# Patient Record
Sex: Male | Born: 1985 | Race: White | Hispanic: No | Marital: Single | Smoking: Never smoker
Health system: Southern US, Community
[De-identification: ages and names within clinical notes are randomized; demographics above are authoritative.]

## PROBLEM LIST (undated history)

## (undated) DIAGNOSIS — L732 Hidradenitis suppurativa: Secondary | ICD-10-CM

## (undated) DIAGNOSIS — K219 Gastro-esophageal reflux disease without esophagitis: Secondary | ICD-10-CM

## (undated) DIAGNOSIS — F329 Major depressive disorder, single episode, unspecified: Secondary | ICD-10-CM

## (undated) DIAGNOSIS — J45909 Unspecified asthma, uncomplicated: Secondary | ICD-10-CM

## (undated) DIAGNOSIS — N390 Urinary tract infection, site not specified: Secondary | ICD-10-CM

## (undated) DIAGNOSIS — F32A Depression, unspecified: Secondary | ICD-10-CM

## (undated) DIAGNOSIS — E669 Obesity, unspecified: Secondary | ICD-10-CM

## (undated) DIAGNOSIS — B019 Varicella without complication: Secondary | ICD-10-CM

## (undated) HISTORY — DX: Varicella without complication: B01.9

## (undated) HISTORY — DX: Hidradenitis suppurativa: L73.2

## (undated) HISTORY — DX: Urinary tract infection, site not specified: N39.0

## (undated) HISTORY — PX: COSMETIC SURGERY: SHX468

## (undated) HISTORY — PX: ABDOMINAL SURGERY: SHX537

## (undated) HISTORY — PX: TONSILLECTOMY: SUR1361

## (undated) HISTORY — DX: Obesity, unspecified: E66.9

## (undated) HISTORY — PX: MYRINGOTOMY: SUR874

## (undated) HISTORY — DX: Depression, unspecified: F32.A

## (undated) HISTORY — DX: Major depressive disorder, single episode, unspecified: F32.9

## (undated) HISTORY — DX: Gastro-esophageal reflux disease without esophagitis: K21.9

---
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
## 2006-02-26 ENCOUNTER — Ambulatory Visit: Payer: Self-pay | Admitting: Oncology

## 2006-07-03 ENCOUNTER — Ambulatory Visit: Payer: Self-pay

## 2007-02-15 IMAGING — CR DG TIBIA/FIBULA 2V*L*
4 series · 4 of 4 positions shown · non-contrast
Comparison: none

CLINICAL DATA: Painful swelling in both medial malleolus.  No known trauma. 
 LEFT TIBIA AND FIBULA ? 4 VIEW:

[view not recorded (1 of 4)]
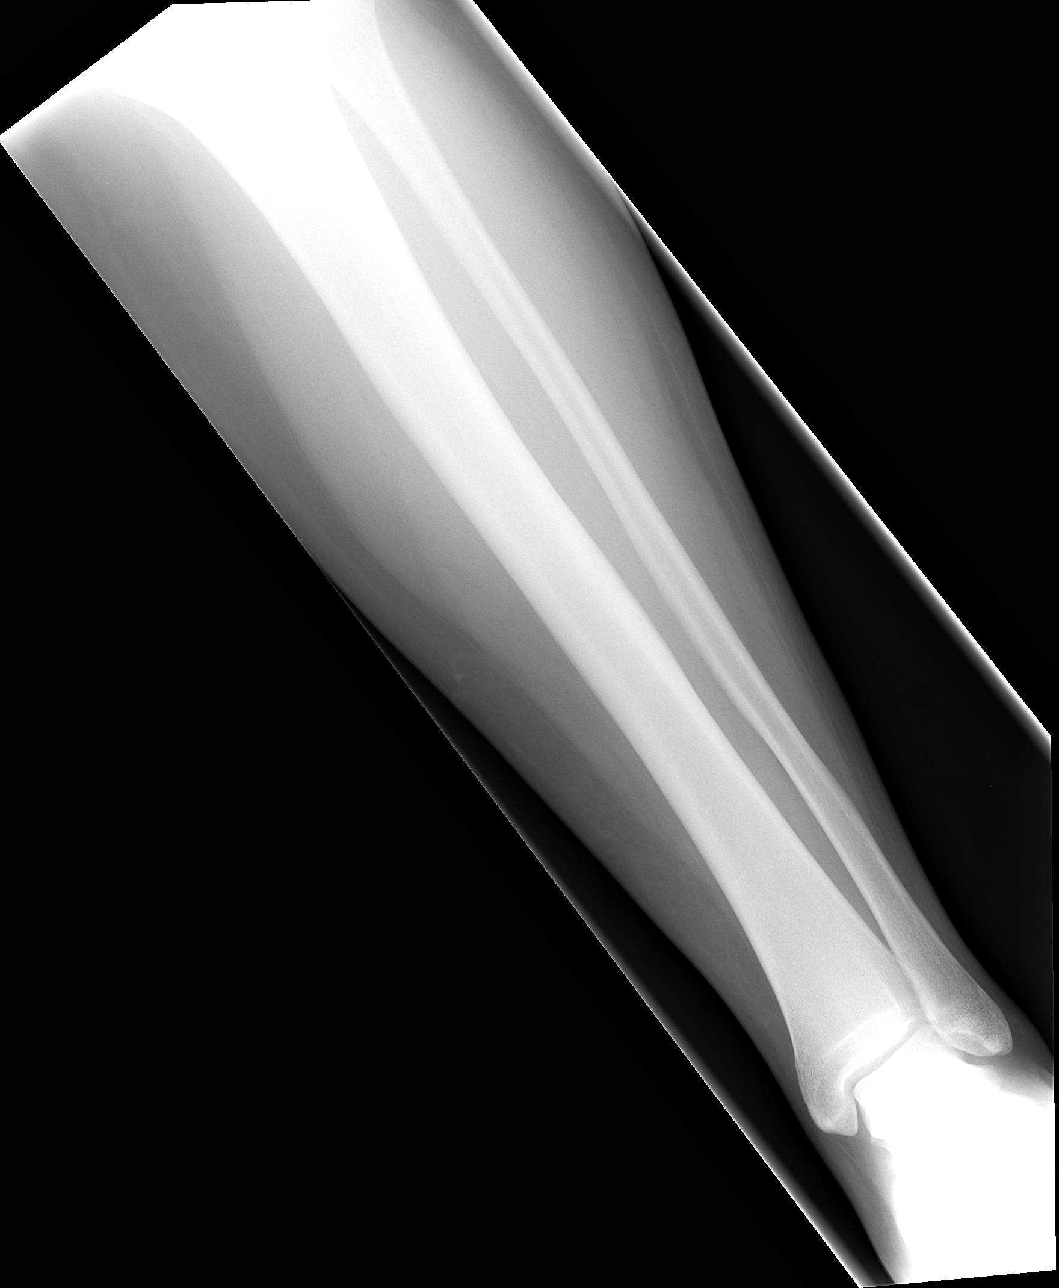

[view not recorded (2 of 4)]
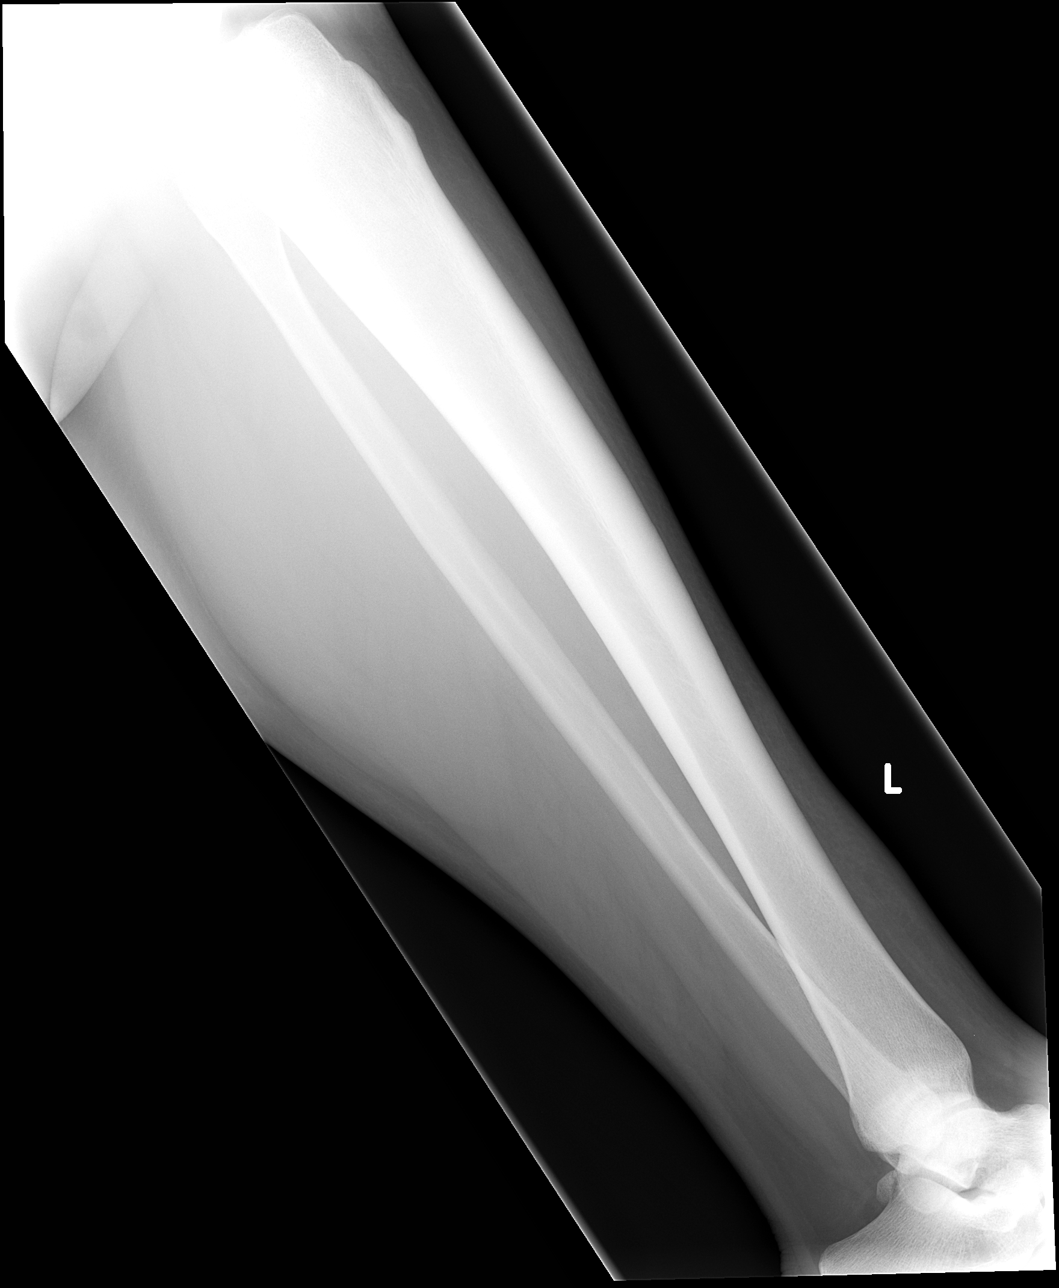

[view not recorded (3 of 4)]
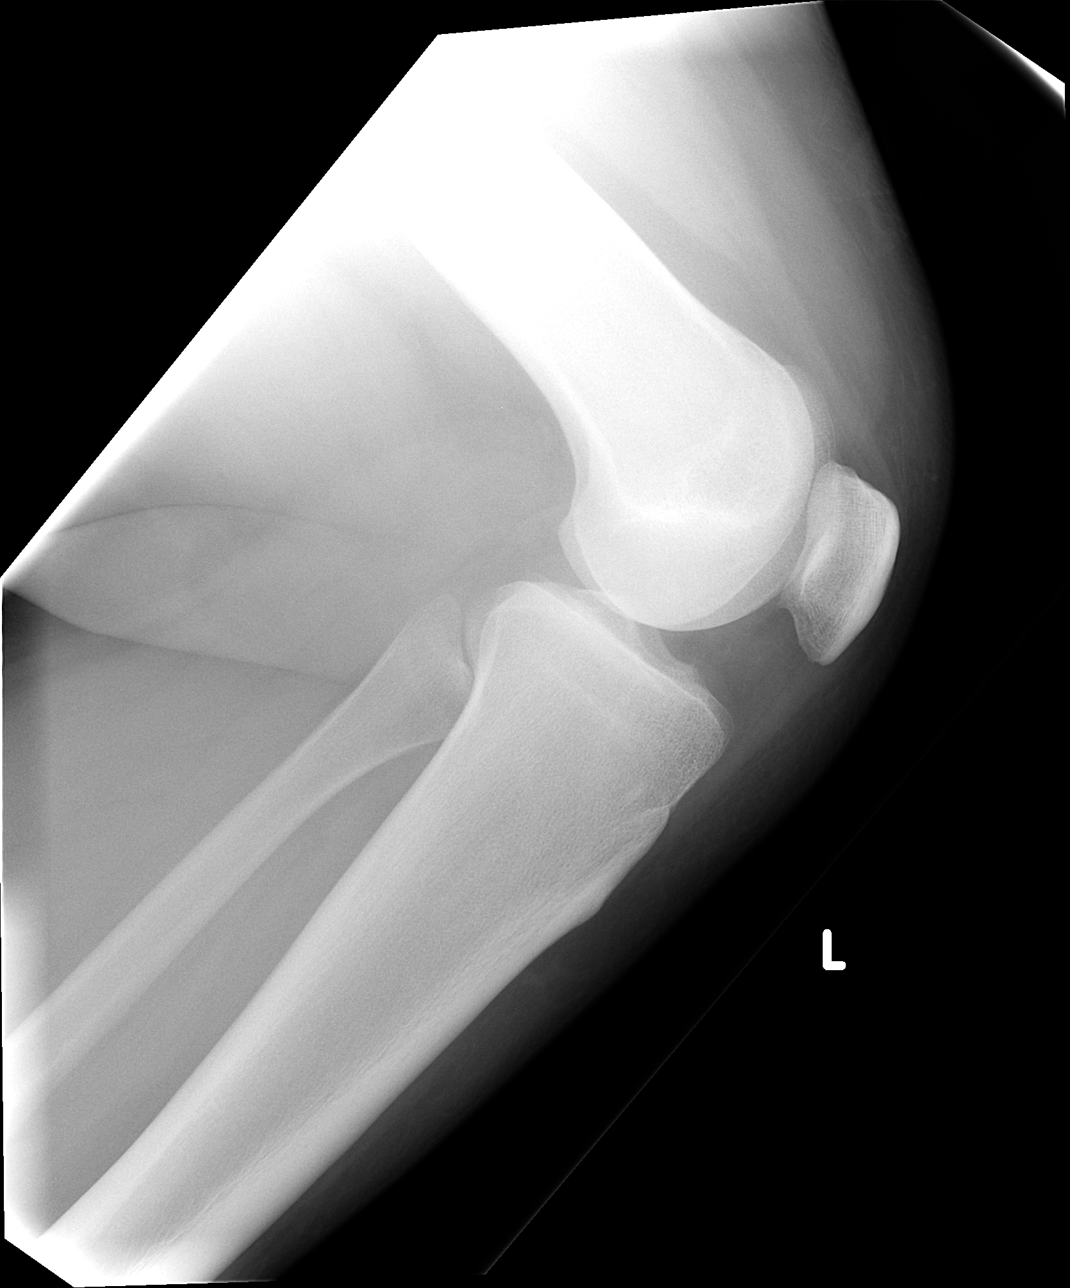

[view not recorded (4 of 4)]
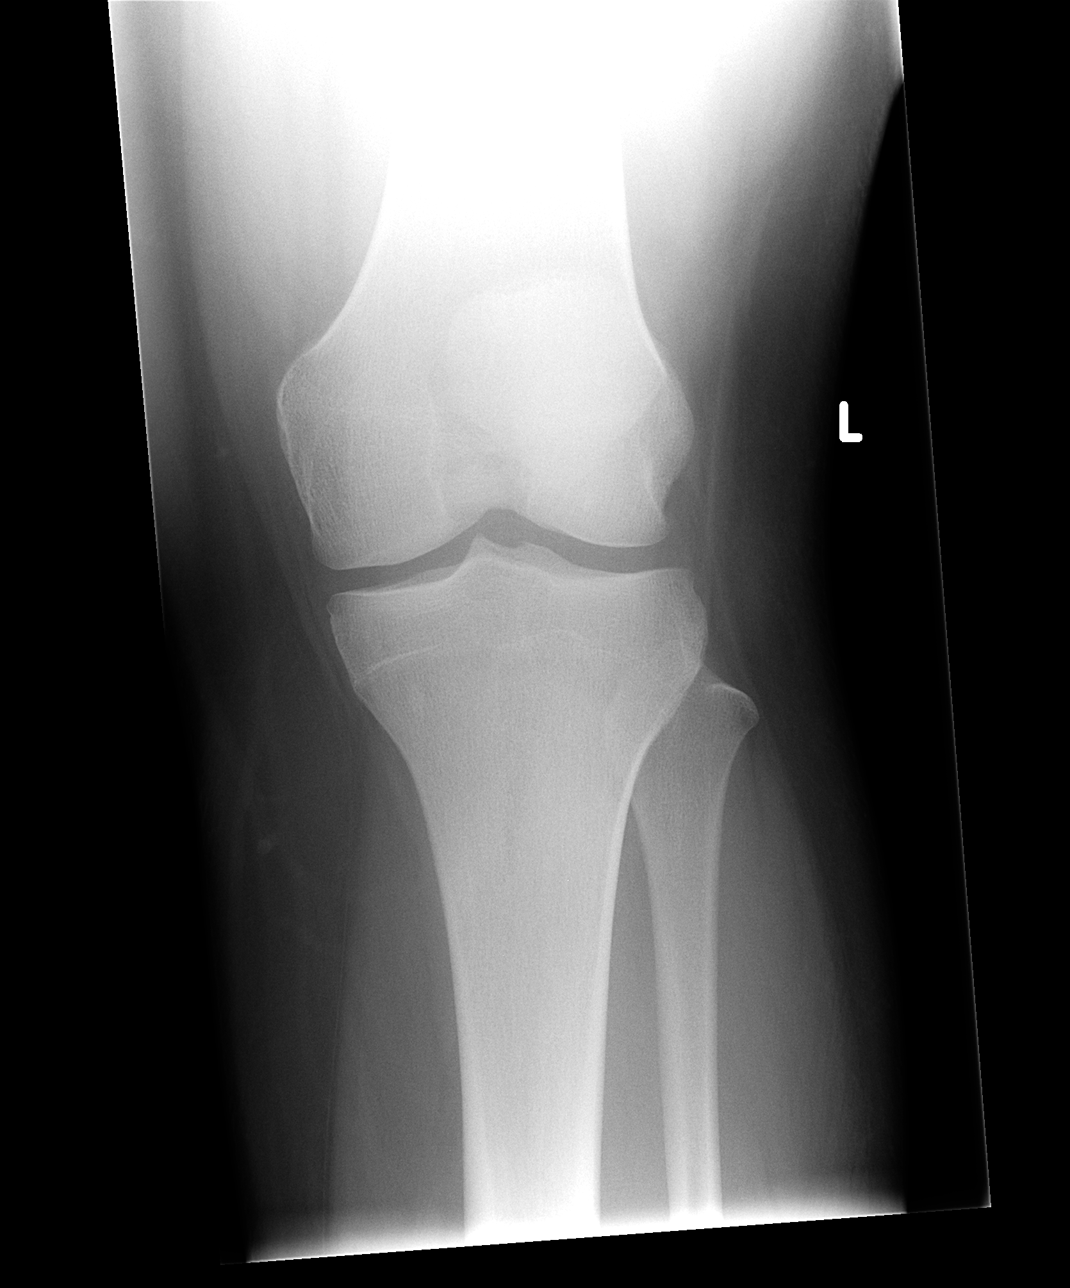

[4 of 4 positions shown; findings below may reference images not displayed]

FINDINGS: There is no evidence of fracture or other focal bone lesions.  Soft tissues are unremarkable.
IMPRESSION: Negative.

## 2008-06-16 ENCOUNTER — Ambulatory Visit: Payer: Self-pay

## 2010-04-30 ENCOUNTER — Encounter: Payer: Self-pay | Admitting: Orthopedic Surgery

## 2011-09-27 ENCOUNTER — Encounter (HOSPITAL_COMMUNITY): Payer: Self-pay | Admitting: Certified Registered"

## 2011-09-27 ENCOUNTER — Encounter (HOSPITAL_COMMUNITY)
Admission: EM | Disposition: A | Discharge status: Home / Self Care | Payer: Self-pay | Source: Ambulatory Visit | Attending: Emergency Medicine

## 2011-09-27 ENCOUNTER — Emergency Department (HOSPITAL_COMMUNITY): Payer: Medicare Other | Admitting: Certified Registered"

## 2011-09-27 ENCOUNTER — Observation Stay (HOSPITAL_COMMUNITY)
Admission: EM | Admit: 2011-09-27 | Discharge: 2011-09-28 | Disposition: A | Discharge status: Home / Self Care | Payer: Medicare Other | Source: Ambulatory Visit | Attending: General Surgery | Admitting: General Surgery

## 2011-09-27 ENCOUNTER — Encounter (HOSPITAL_COMMUNITY): Payer: Self-pay

## 2011-09-27 ENCOUNTER — Emergency Department (HOSPITAL_COMMUNITY): Payer: Medicare Other

## 2011-09-27 DIAGNOSIS — J45909 Unspecified asthma, uncomplicated: Secondary | ICD-10-CM | POA: Insufficient documentation

## 2011-09-27 DIAGNOSIS — IMO0002 Reserved for concepts with insufficient information to code with codable children: Secondary | ICD-10-CM

## 2011-09-27 DIAGNOSIS — W19XXXA Unspecified fall, initial encounter: Secondary | ICD-10-CM | POA: Insufficient documentation

## 2011-09-27 DIAGNOSIS — Y92009 Unspecified place in unspecified non-institutional (private) residence as the place of occurrence of the external cause: Secondary | ICD-10-CM | POA: Insufficient documentation

## 2011-09-27 DIAGNOSIS — R55 Syncope and collapse: Secondary | ICD-10-CM | POA: Insufficient documentation

## 2011-09-27 DIAGNOSIS — S0180XA Unspecified open wound of other part of head, initial encounter: Secondary | ICD-10-CM | POA: Insufficient documentation

## 2011-09-27 HISTORY — PX: ABDOMINOPLASTY: SHX5355

## 2011-09-27 HISTORY — DX: Unspecified asthma, uncomplicated: J45.909

## 2011-09-27 LAB — DIFFERENTIAL
Basophils Relative: 0 % (ref 0–1)
Eosinophils Absolute: 0 10*3/uL (ref 0.0–0.7)
Eosinophils Relative: 0 % (ref 0–5)
Lymphs Abs: 1.3 10*3/uL (ref 0.7–4.0)
Monocytes Absolute: 1.9 10*3/uL — ABNORMAL HIGH (ref 0.1–1.0)
Neutro Abs: 18.3 10*3/uL — ABNORMAL HIGH (ref 1.7–7.7)

## 2011-09-27 LAB — PREPARE RBC (CROSSMATCH)

## 2011-09-27 LAB — CBC
HCT: 40.5 % (ref 39.0–52.0)
MCH: 30.6 pg (ref 26.0–34.0)
MCHC: 34.1 g/dL (ref 30.0–36.0)
MCV: 89.8 fL (ref 78.0–100.0)
Platelets: 299 10*3/uL (ref 150–400)
RBC: 4.51 MIL/uL (ref 4.22–5.81)
WBC: 21.5 10*3/uL — ABNORMAL HIGH (ref 4.0–10.5)

## 2011-09-27 LAB — URINALYSIS, ROUTINE W REFLEX MICROSCOPIC: Glucose, UA: NEGATIVE mg/dL

## 2011-09-27 LAB — POCT I-STAT, CHEM 8
Chloride: 104 mEq/L (ref 96–112)
Glucose, Bld: 166 mg/dL — ABNORMAL HIGH (ref 70–99)
HCT: 41 % (ref 39.0–52.0)
TCO2: 22 mmol/L (ref 0–100)

## 2011-09-27 LAB — URINE MICROSCOPIC-ADD ON

## 2011-09-27 SURGERY — ABDOMINOPLASTY
Anesthesia: General | Site: Abdomen | Wound class: Clean

## 2011-09-27 MED ORDER — HYDROMORPHONE HCL PF 1 MG/ML IJ SOLN
0.2500 mg | INTRAMUSCULAR | Status: DC | PRN
  Administered 2011-09-27 (×2): 0.5 mg via INTRAVENOUS

## 2011-09-27 MED ORDER — FENTANYL CITRATE 0.05 MG/ML IJ SOLN
INTRAMUSCULAR | Status: DC | PRN
  Administered 2011-09-27: 100 ug via INTRAVENOUS
  Administered 2011-09-27: 50 ug via INTRAVENOUS
  Administered 2011-09-27: 100 ug via INTRAVENOUS

## 2011-09-27 MED ORDER — GLYCOPYRROLATE 0.2 MG/ML IJ SOLN
INTRAMUSCULAR | Status: DC | PRN
  Administered 2011-09-27: 0.4 mg via INTRAVENOUS

## 2011-09-27 MED ORDER — CEFAZOLIN SODIUM 1-5 GM-% IV SOLN
INTRAVENOUS | Status: DC | PRN
  Administered 2011-09-27 (×2): 1 g via INTRAVENOUS

## 2011-09-27 MED ORDER — SODIUM CHLORIDE 0.9 % IV SOLN
INTRAVENOUS | Status: DC
  Administered 2011-09-27: 2000 mL via INTRAVENOUS

## 2011-09-27 MED ORDER — ONDANSETRON HCL 4 MG/2ML IJ SOLN
INTRAMUSCULAR | Status: DC | PRN
  Administered 2011-09-27: 4 mg via INTRAVENOUS

## 2011-09-27 MED ORDER — NEOSTIGMINE METHYLSULFATE 1 MG/ML IJ SOLN
INTRAMUSCULAR | Status: DC | PRN
  Administered 2011-09-27: 3 mg via INTRAVENOUS

## 2011-09-27 MED ORDER — MIDAZOLAM HCL 5 MG/5ML IJ SOLN
INTRAMUSCULAR | Status: DC | PRN
  Administered 2011-09-27: 2 mg via INTRAVENOUS

## 2011-09-27 MED ORDER — PROPOFOL 10 MG/ML IV EMUL
INTRAVENOUS | Status: DC | PRN
  Administered 2011-09-27: 200 mg via INTRAVENOUS

## 2011-09-27 MED ORDER — HYDROMORPHONE HCL PF 1 MG/ML IJ SOLN
INTRAMUSCULAR | Status: AC
  Filled 2011-09-27: qty 1

## 2011-09-27 MED ORDER — ROCURONIUM BROMIDE 100 MG/10ML IV SOLN
INTRAVENOUS | Status: DC | PRN
  Administered 2011-09-27: 10 mg via INTRAVENOUS
  Administered 2011-09-27: 30 mg via INTRAVENOUS

## 2011-09-27 MED ORDER — SUCCINYLCHOLINE CHLORIDE 20 MG/ML IJ SOLN
INTRAMUSCULAR | Status: DC | PRN
  Administered 2011-09-27: 120 mg via INTRAVENOUS

## 2011-09-27 MED ORDER — LIDOCAINE HCL (CARDIAC) 20 MG/ML IV SOLN
INTRAVENOUS | Status: DC | PRN
  Administered 2011-09-27: 70 mg via INTRAVENOUS

## 2011-09-27 SURGICAL SUPPLY — 50 items
ATCH SMKEVC FLXB CAUT HNDSWH (FILTER) IMPLANT
BAG DECANTER FOR FLEXI CONT (MISCELLANEOUS) IMPLANT
BENZOIN TINCTURE PRP APPL 2/3 (GAUZE/BANDAGES/DRESSINGS) IMPLANT
BINDER ABD UNIV 12 45-62 (WOUND CARE) ×1 IMPLANT
BINDER ABDOMINAL 46IN 62IN (WOUND CARE) ×2
BLADE KNIFE  20 PERSONNA (BLADE)
BLADE KNIFE 20 PERSONNA (BLADE) IMPLANT
BLADE KNIFE PERSONA 15 (BLADE) IMPLANT
CLOTH BEACON ORANGE TIMEOUT ST (SAFETY) ×2 IMPLANT
CONNECTOR 5 IN 1 STRAIGHT STRL (MISCELLANEOUS) IMPLANT
COVER SURGICAL LIGHT HANDLE (MISCELLANEOUS) ×2 IMPLANT
DRAIN CHANNEL 10F 3/8 F FF (DRAIN) IMPLANT
DRAPE LAPAROSCOPIC ABDOMINAL (DRAPES) ×2 IMPLANT
DRAPE WARM FLUID 44X44 (DRAPE) ×2 IMPLANT
DRSG MEPILEX BORDER 4X12 (GAUZE/BANDAGES/DRESSINGS) ×8 IMPLANT
DRSG PAD ABDOMINAL 8X10 ST (GAUZE/BANDAGES/DRESSINGS) IMPLANT
ELECT CAUTERY BLADE 6.4 (BLADE) ×2 IMPLANT
ELECT REM PT RETURN 9FT ADLT (ELECTROSURGICAL) ×2
ELECTRODE REM PT RTRN 9FT ADLT (ELECTROSURGICAL) ×1 IMPLANT
EVACUATOR PREFILTER SMOKE (MISCELLANEOUS) IMPLANT
EVACUATOR SILICONE 100CC (DRAIN) IMPLANT
EVACUATOR SMOKE ACCUVAC VALLEY (FILTER)
GAUZE XEROFORM 5X9 LF (GAUZE/BANDAGES/DRESSINGS) ×2 IMPLANT
GLOVE ECLIPSE 7.0 STRL STRAW (GLOVE) ×4 IMPLANT
GOWN STRL NON-REIN LRG LVL3 (GOWN DISPOSABLE) ×6 IMPLANT
KIT BASIN OR (CUSTOM PROCEDURE TRAY) ×2 IMPLANT
KIT ROOM TURNOVER OR (KITS) ×2 IMPLANT
NEEDLE SPNL 18GX3.5 QUINCKE PK (NEEDLE) IMPLANT
NS IRRIG 1000ML POUR BTL (IV SOLUTION) ×8 IMPLANT
PACK GENERAL/GYN (CUSTOM PROCEDURE TRAY) ×2 IMPLANT
PAD ARMBOARD 7.5X6 YLW CONV (MISCELLANEOUS) ×4 IMPLANT
PEN SKIN MARKING BROAD (MISCELLANEOUS) ×2 IMPLANT
PIN SAFETY STERILE (MISCELLANEOUS) IMPLANT
PREFILTER EVAC NS 1 1/3-3/8IN (MISCELLANEOUS) IMPLANT
PREFILTER SMOKE EVAC (FILTER) IMPLANT
SPECIMEN JAR LARGE (MISCELLANEOUS) IMPLANT
SPONGE GAUZE 4X4 12PLY (GAUZE/BANDAGES/DRESSINGS) ×2 IMPLANT
SPONGE LAP 18X18 X RAY DECT (DISPOSABLE) ×10 IMPLANT
STAPLER VISISTAT 35W (STAPLE) ×4 IMPLANT
STRIP CLOSURE SKIN 1/2X4 (GAUZE/BANDAGES/DRESSINGS) IMPLANT
SUT MNCRL AB 3-0 PS2 18 (SUTURE) IMPLANT
SUT MON AB 5-0 PS2 18 (SUTURE) IMPLANT
SUT PROLENE 3 0 PS 1 (SUTURE) IMPLANT
SUT VIC AB 2-0 CT1 27 (SUTURE) ×8
SUT VIC AB 2-0 CT1 TAPERPNT 27 (SUTURE) ×8 IMPLANT
SYR 50ML SLIP (SYRINGE) ×4 IMPLANT
TOWEL OR 17X24 6PK STRL BLUE (TOWEL DISPOSABLE) ×2 IMPLANT
TOWEL OR 17X26 10 PK STRL BLUE (TOWEL DISPOSABLE) ×4 IMPLANT
TUBING 1/4  WITH 7/8  ADAPTER (MISCELLANEOUS) IMPLANT
WATER STERILE IRR 1000ML POUR (IV SOLUTION) IMPLANT

## 2011-09-27 NOTE — Anesthesia Preprocedure Evaluation (Addendum)
Anesthesia Evaluation  Patient identified by MRN, date of birth, ID band Patient awake    Reviewed: Allergy & Precautions, H&P , NPO status , Patient's Chart, lab work & pertinent test results  Airway Mallampati: II TM Distance: >3 FB Neck ROM: Full    Dental  (+) Teeth Intact and Dental Advisory Given   Pulmonary neg pulmonary ROS, asthma ,  breath sounds clear to auscultation        Cardiovascular Rhythm:Regular Rate:Normal     Neuro/Psych    GI/Hepatic Neg liver ROS, History of abdominalplasty. CE   Endo/Other  negative endocrine ROS  Renal/GU negative Renal ROS     Musculoskeletal   Abdominal   Peds  Hematology   Anesthesia Other Findings   Reproductive/Obstetrics                         Anesthesia Physical Anesthesia Plan  ASA: III  Anesthesia Plan: General   Post-op Pain Management:    Induction: Intravenous  Airway Management Planned: Oral ETT  Additional Equipment:   Intra-op Plan:   Post-operative Plan:   Informed Consent:   Plan Discussed with: CRNA  Anesthesia Plan Comments:        Anesthesia Quick Evaluation

## 2011-09-27 NOTE — Op Note (Signed)
09/27/2011  11:02 PM  PATIENT:  Danny Graves  26 y.o. male  PRE-OPERATIVE DIAGNOSIS:  post op bleeding of abdominal wound, s/p abdominoplasty  POST-OPERATIVE DIAGNOSIS: post op bleeding, s/p abdominoplasty  PROCEDURE:  Procedure(s):exploration of wound, evacuation of hematoma, cauterization of bleeding, layered closure of abdominal wound - extensive; closure of upper eye lid laceration 2cm ABDOMINOPLASTY  SURGEON:  Surgeon(s): Johnette Abraham, MD Shelly Rubenstein, MD  ANESTHESIA:   general  SPECIMEN:  No Specimen  FINDINGS:  A few small bleeding vessels abdominal wall, evacuated ~250cc clots  n/a  PATIENT DISPOSITION:  PACU - hemodynamically stable.

## 2011-09-27 NOTE — ED Provider Notes (Signed)
History     CSN: [id number]  Arrival date & time 09/27/11  1808   None     No chief complaint on file.   (Consider location/radiation/quality/duration/timing/severity/associated sxs/prior treatment) HPI Comments: The patient is a 26 year old man who had an abdominoplasty at [facility] today.  Dr. Kandace Blitz, his surgeon, says that the surgery took 3 hours and there was no bleeding at the time he closed.  The pt got home and fainted twice.  He had severe bleeding from below his abdominal girdle.  He was brought by EMS to [facility] ED for evaluation.    Patient is a 26 y.o. male presenting with trauma.  Trauma Episode onset: He had an abdominoplasty some 6-8 hours ago. Episode frequency: He has continued active bleeding from his dressings. The problem has not changed since onset.Associated symptoms comments: He had 2 syncopal episodes at home and suffered a left forehead scalp laceration.. Nothing aggravates the symptoms. Nothing relieves the symptoms. Treatments tried: He was transported to [facility] by EMS.    No past medical history on file.  No past surgical history on file.  No family history on file.  History  Substance Use Topics  . Smoking status: Not on file  . Smokeless tobacco: Not on file  . Alcohol Use: Not on file      Review of Systems  Constitutional: Negative.   HENT: Negative.   Eyes: Negative.   Respiratory: Negative.   Cardiovascular: Negative.   Gastrointestinal:       Bleeding from site of abdominoplasty.  Genitourinary: Negative.   Musculoskeletal: Negative.   Skin:       Laceration left eyebrow.  Neurological: Positive for syncope.  Psychiatric/Behavioral: Negative.     Allergies  Review of patient's allergies indicates not on file.  Home Medications  No current outpatient prescriptions on file.  BP 108/64  Pulse 59  Temp 98 F (36.7 C) (Oral)  Resp 23  SpO2 100%  Physical Exam  Nursing note and vitals reviewed. Constitutional: He is  oriented to person, place, and time. He appears well-developed and well-nourished. No distress.       BP 108/64  HENT:  Head: Normocephalic and atraumatic.  Right Ear: External ear normal.  Left Ear: External ear normal.  Mouth/Throat: Oropharynx is clear and moist.  Eyes: Conjunctivae and EOM are normal. Pupils are equal, round, and reactive to light.  Neck: Normal range of motion. Neck supple.  Cardiovascular: Normal rate, regular rhythm and normal heart sounds.   Pulmonary/Chest: Effort normal and breath sounds normal.  Abdominal:       Pt has abdominal girdle on , with fresh blood bleeding through the girdle front and back, and 3 suction drains full.    Musculoskeletal: Normal range of motion.  Neurological: He is alert and oriented to person, place, and time.       No sensory or motor deficit.  Skin:       Sweaty.  Psychiatric: He has a normal mood and affect. His behavior is normal.    ED Course  CRITICAL CARE Performed by: Osvaldo Human Authorized by: Osvaldo Human Total critical care time: 60 minutes Critical care was necessary to treat or prevent imminent or life-threatening deterioration of the following conditions: Pt with abdominoplasty earlier today, now with severe postop bleeding.  Instituted treatment with IV fluids, ordered labs, multiple telephone calls to consultants to get pt to the OR.  Critical care was time spent personally by me  on the following activities: evaluation of patient's response to treatment, examination of patient, obtaining history from patient or surrogate, ordering and performing treatments and interventions, ordering and review of laboratory studies, re-evaluation of patient's condition and discussions with consultants.   (including critical care time)   Labs Reviewed  CBC  DIFFERENTIAL  URINALYSIS, ROUTINE W REFLEX MICROSCOPIC  SAMPLE TO BLOOD BANK  TYPE AND SCREEN  PREPARE RBC (CROSSMATCH)   6:31 PM Pt seen STAT on arrival.   He had an abdominal girdle that had patches of blood on both his abdominal and back surfaces.  His Systolic BP was 106. 2 large bore IV's were started.  Blood was drawn for CBC, ISTAT 8, hold clot.  Call to Blood Bank for 2 units uncrossmatched PRBC's.  Call to [facility] to Dr. Kandace Blitz --> recommends calling surgeon to re-explore wound.      Date: 09/27/2011  Rate: 67  Rhythm: normal sinus rhythm  QRS Axis: normal  Intervals: normal  ST/T Wave abnormalities: normal  Conduction Disutrbances:none  Narrative Interpretation: Normal EKG.  Old EKG Reviewed: none available  Discussed with Dr. Magnus Ivan, advised to call Plastic Surgery.  Current BP 123 systolic, feeling a little better.  7:12 PM Dr. Izora Ribas contacted.  He would like to talk to Dr. Magnus Ivan, to see if they can explore pt together.  Requested flow manager to page Dr. Magnus Ivan to Dr. Izora Ribas.  7:36 PM Pt seen by dr. Magnus Ivan, who will take pt to OR.   1. Post-op bleeding          Carleene Cooper III, MD 09/27/11 [phone]

## 2011-09-27 NOTE — ED Notes (Signed)
Patient was released from [facility] post abdomenoplasty. Fell at home trying to go to the bathroom. Got up and passed out again. Feels he may have "torn open some stitches". Extensive blood loss noted as approximately 400 cc not including the JP drain bulbs. Blood still seeping significantly out of the compression garments. EMT states the surgeon advised to leave the compression garment intact and only place light pressure to control the bleeding until he could be transported to [facility]. EMT diverted to [facility] for stabilization. Patient is dizzy and afraid to go to sleep as he is aware of the amount of blood loss, but oriented and able to answer questions appropriately.

## 2011-09-27 NOTE — ED Notes (Signed)
Patient to OR

## 2011-09-27 NOTE — ED Notes (Signed)
Patient has approximately 100cc blood in each JP drainage device.

## 2011-09-27 NOTE — Preoperative (Signed)
Beta Blockers   Reason not to administer Beta Blockers:Not Applicable 

## 2011-09-27 NOTE — Anesthesia Procedure Notes (Signed)
Procedure Name: Intubation Date/Time: 09/27/2011 9:45 PM Performed by: Jefm Miles E Pre-anesthesia Checklist: Patient identified, Timeout performed, Emergency Drugs available, Suction available and Patient being monitored Patient Re-evaluated:Patient Re-evaluated prior to inductionOxygen Delivery Method: Circle system utilized Preoxygenation: Pre-oxygenation with 100% oxygen Intubation Type: IV induction and Rapid sequence Laryngoscope Size: Mac and 4 Grade View: Grade I Tube type: Oral Tube size: 7.5 mm Number of attempts: 1 Airway Equipment and Method: Stylet Placement Confirmation: ETT inserted through vocal cords under direct vision,  breath sounds checked- equal and bilateral and positive ETCO2 Secured at: 23 cm Tube secured with: Tape Dental Injury: Teeth and Oropharynx as per pre-operative assessment

## 2011-09-27 NOTE — H&P (Signed)
Danny Graves is an 26 y.o. male.   Chief Complaint: bleeding HPI: this is a 26 year old gentleman who underwent an abdominoplasty by plastic surgeons at [facility] earlier today. After returning home, he had a syncopal episode and fell hitting his abdomen. Since then, he has had increasing bloody drainage in his Jackson-Pratt drains as well as underneath his abdominal binder. He presented via EMS. He was originally found to be hypotensive with a systolic blood pressure of 108.  His i-STAT hemoglobin is 13. He responded to IV fluids. He is currently undergoing transfusion of emergency release blood. He is awake and alert and has no complaints. He only has mild incisional discomfort.  Dr. Izora Ribas (plastic surgery) and I have been called emergently.  He did get his head when as he fail. He currently denies headache.  History reviewed. No pertinent past medical history.  Past Surgical History  Procedure Date  . Abdominal surgery   . Cosmetic surgery     History reviewed. No pertinent family history. Social History:  does not have a smoking history on file. He does not have any smokeless tobacco history on file. His alcohol and drug histories not on file.  Allergies: No Known Allergies   (Not in a hospital admission)  Results for orders placed during the hospital encounter of 09/27/11 (from the past 48 hour(s))  CBC     Status: Abnormal   Collection Time   09/27/11  6:25 PM      Component Value Range Comment   WBC 21.5 (*) 4.0 - 10.5 K/uL    RBC 4.51  4.22 - 5.81 MIL/uL    Hemoglobin 13.8  13.0 - 17.0 g/dL    HCT 96.0  45.4 - 09.8 %    MCV 89.8  78.0 - 100.0 fL    MCH 30.6  26.0 - 34.0 pg    MCHC 34.1  30.0 - 36.0 g/dL    RDW 11.9  14.7 - 82.9 %    Platelets 299  150 - 400 K/uL   DIFFERENTIAL     Status: Abnormal   Collection Time   09/27/11  6:25 PM      Component Value Range Comment   Neutrophils Relative 85 (*) 43 - 77 %    Lymphocytes Relative 6 (*) 12 - 46 %    Monocytes  Relative 9  3 - 12 %    Eosinophils Relative 0  0 - 5 %    Basophils Relative 0  0 - 1 %    Neutro Abs 18.3 (*) 1.7 - 7.7 K/uL    Lymphs Abs 1.3  0.7 - 4.0 K/uL    Monocytes Absolute 1.9 (*) 0.1 - 1.0 K/uL    Eosinophils Absolute 0.0  0.0 - 0.7 K/uL    Basophils Absolute 0.0  0.0 - 0.1 K/uL    WBC Morphology TOXIC GRANULATION     TYPE AND SCREEN     Status: Normal (Preliminary result)   Collection Time   09/27/11  6:25 PM      Component Value Range Comment   ABO/RH(D) A POS      Antibody Screen NEG      Sample Expiration 09/30/2011      Unit Number [id number]      Blood Component Type RED CELLS,LR      Unit division 00      Status of Unit REL FROM [facility]      Unit tag comment VERBAL ORDERS PER DR Ignacia Palma  Transfusion Status OK TO TRANSFUSE      Crossmatch Result NOT NEEDED      Unit Number [id number]      Blood Component Type RED CELLS,LR      Unit division 00      Status of Unit REL FROM [facility]      Unit tag comment VERBAL ORDERS PER DR DAVIDSON      Transfusion Status OK TO TRANSFUSE      Crossmatch Result NOT NEEDED      Unit Number [id number]      Blood Component Type RED CELLS,LR      Unit division 00      Status of Unit ISSUED      Transfusion Status OK TO TRANSFUSE      Crossmatch Result Compatible      Unit Number [id number]      Blood Component Type RED CELLS,LR      Unit division 00      Status of Unit ISSUED      Transfusion Status OK TO TRANSFUSE      Crossmatch Result Compatible      Unit Number [id number]      Blood Component Type RED CELLS,LR      Unit division 00      Status of Unit ALLOCATED      Transfusion Status OK TO TRANSFUSE      Crossmatch Result Compatible      Unit Number [id number]      Blood Component Type RED CELLS,LR      Unit division 00      Status of Unit ALLOCATED      Transfusion Status OK TO TRANSFUSE      Crossmatch Result Compatible     ABO/RH     Status: Normal   Collection Time   09/27/11  6:25 PM      Component Value Range  Comment   ABO/RH(D) A POS     POCT I-STAT, CHEM 8     Status: Abnormal   Collection Time   09/27/11  6:37 PM      Component Value Range Comment   Sodium 139  135 - 145 mEq/L    Potassium 4.8  3.5 - 5.1 mEq/L    Chloride 104  96 - 112 mEq/L    BUN 12  6 - 23 mg/dL    Creatinine, Ser 4.03  0.50 - 1.35 mg/dL    Glucose, Bld 474 (*) 70 - 99 mg/dL    Calcium, Ion 2.59 (*) 1.12 - 1.32 mmol/L    TCO2 22  0 - 100 mmol/L    Hemoglobin 13.9  13.0 - 17.0 g/dL    HCT 56.3  87.5 - 64.3 %   URINALYSIS, ROUTINE W REFLEX MICROSCOPIC     Status: Abnormal   Collection Time   09/27/11  6:52 PM      Component Value Range Comment   Color, Urine YELLOW  YELLOW    APPearance CLEAR  CLEAR    Specific Gravity, Urine 1.019  1.005 - 1.030    pH 6.0  5.0 - 8.0    Glucose, UA NEGATIVE  NEGATIVE mg/dL    Hgb urine dipstick NEGATIVE  NEGATIVE    Bilirubin Urine NEGATIVE  NEGATIVE    Ketones, ur NEGATIVE  NEGATIVE mg/dL    Protein, ur NEGATIVE  NEGATIVE mg/dL    Urobilinogen, UA 0.2  0.0 - 1.0 mg/dL    Nitrite NEGATIVE  NEGATIVE    Leukocytes, UA SMALL (*)  NEGATIVE   URINE MICROSCOPIC-ADD ON     Status: Normal   Collection Time   09/27/11  6:52 PM      Component Value Range Comment   Squamous Epithelial / LPF RARE  RARE    WBC, UA 0-2  <3 WBC/hpf    RBC / HPF 0-2  <3 RBC/hpf   PREPARE RBC (CROSSMATCH)     Status: Normal   Collection Time   09/27/11  7:00 PM      Component Value Range Comment   Order Confirmation ORDER PROCESSED BY BLOOD BANK      Dg Chest Port 1 View  09/27/2011  *RADIOLOGY REPORT*  Clinical Data: Severe bleeding from abdominoplasty incision.  PORTABLE CHEST - 1 VIEW  Comparison: None.  Findings: Heart size and vascularity are normal and the lungs are clear.  No osseous abnormality.  IMPRESSION: Normal chest.  Original Report Authenticated By: Gwynn Burly, M.D.    Review of Systems  All other systems reviewed and are negative.    Blood pressure 127/64, pulse 89, temperature  98.4 F (36.9 C), temperature source Oral, resp. rate 23, SpO2 100.00%. Physical Exam  Constitutional: He is oriented to person, place, and time. He appears well-developed and well-nourished. No distress.  HENT:  Head: Normocephalic.  Right Ear: External ear normal.  Left Ear: External ear normal.       There is a small 2 cm laceration just underneath the left eyebrow  Eyes: Conjunctivae are normal. Pupils are equal, round, and reactive to light.  Neck: Normal range of motion. Neck supple. No JVD present. No tracheal deviation present.  Cardiovascular: Normal rate, regular rhythm, normal heart sounds and intact distal pulses.   No murmur heard. Respiratory: Effort normal and breath sounds normal. No respiratory distress. He has no wheezes.  GI: Soft.       There is a large abdominal binder in place. It was saturated with blood. He has several Jackson-Pratt drains which were full of bloody fluid. 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  Skin: Skin is warm and dry. No rash noted. He is not diaphoretic. No erythema. There is pallor.  Psychiatric: His behavior is normal. Judgment normal.     Assessment/Plan Postoperative bleeding status post abdominoplasty followed by a fall.  Patient is currently hemostatically stable. We will proceed urgently to the operating room for wound exploration with evacuation of the hematoma and washout of the wound. The risks were discussed with him in detail by plastic surgery. This includes a risk of wound infection. Preoperative antibiotics will be given.  Kiara Mcdowell A 09/27/2011, 7:59 PM

## 2011-09-27 NOTE — Consult Note (Signed)
Reason for Consult:bleeding Referring Physician: ER  LAWERENCE DERY is an 26 y.o.  male.  HPI: Pt underwent an abdominoplasty today at [facility],  Was released home.  At home he had two syncopal episodes after which time he noted an increase in drainage from his drains, soaking of his abdominal garment.  He did hit his head and noticed a small amount of bleeding from his left upper eye.  He currently denies headache or blurry vision, just mild abdominal discomfort.  He was given PRBC's in the ER and fluid resusitated as his blood pressure was initially 108 systolic.  History reviewed. No pertinent past medical history. with exception of obesity  Past Surgical History  Procedure Date  . Abdominal surgery   . Cosmetic surgery   . Tonsillectomy   . Myringotomy     History reviewed. No pertinent family history.  Social History:  reports that he has never smoked. He does not have any smokeless tobacco history on file. He reports that he drinks alcohol. He reports that he does not use illicit drugs.  Allergies: No Known Allergies  Medications: I have reviewed the patient's current medications.  Results for orders placed during the hospital encounter of 09/27/11 (from the past 48 hour(s))  CBC     Status: Abnormal   Collection Time   09/27/11  6:25 PM      Component Value Range Comment   WBC 21.5 (*) 4.0 - 10.5 K/uL    RBC 4.51  4.22 - 5.81 MIL/uL    Hemoglobin 13.8  13.0 - 17.0 g/dL    HCT 96.0  45.4 - 09.8 %    MCV 89.8  78.0 - 100.0 fL    MCH 30.6  26.0 - 34.0 pg    MCHC 34.1  30.0 - 36.0 g/dL    RDW 11.9  14.7 - 82.9 %    Platelets 299  150 - 400 K/uL   DIFFERENTIAL     Status: Abnormal   Collection Time   09/27/11  6:25 PM      Component Value Range Comment   Neutrophils Relative 85 (*) 43 - 77 %    Lymphocytes Relative 6 (*) 12 - 46 %    Monocytes Relative 9  3 - 12 %    Eosinophils Relative 0  0 - 5 %    Basophils Relative 0  0 - 1 %    Neutro Abs 18.3 (*) 1.7 - 7.7 K/uL    Lymphs Abs 1.3  0.7 - 4.0 K/uL    Monocytes Absolute 1.9 (*) 0.1 - 1.0 K/uL    Eosinophils Absolute 0.0  0.0 - 0.7 K/uL    Basophils Absolute 0.0  0.0 - 0.1 K/uL    WBC Morphology TOXIC GRANULATION     TYPE AND SCREEN     Status: Normal (Preliminary result)   Collection Time   09/27/11  6:25 PM      Component Value Range Comment   ABO/RH(D) A POS      Antibody Screen NEG      Sample Expiration 09/30/2011      Unit Number [id number]      Blood Component Type RED CELLS,LR      Unit division 00      Status of Unit REL FROM [facility]      Unit tag comment VERBAL ORDERS PER DR DAVIDSON      Transfusion Status OK TO TRANSFUSE      Crossmatch Result NOT NEEDED  Unit Number [id number]      Blood Component Type RED CELLS,LR      Unit division 00      Status of Unit REL FROM [facility]      Unit tag comment VERBAL ORDERS PER DR DAVIDSON      Transfusion Status OK TO TRANSFUSE      Crossmatch Result NOT NEEDED      Unit Number [id number]      Blood Component Type RED CELLS,LR      Unit division 00      Status of Unit ISSUED      Transfusion Status OK TO TRANSFUSE      Crossmatch Result Compatible      Unit Number [id number]      Blood Component Type RED CELLS,LR      Unit division 00      Status of Unit ISSUED      Transfusion Status OK TO TRANSFUSE      Crossmatch Result Compatible      Unit Number [id number]      Blood Component Type RED CELLS,LR      Unit division 00      Status of Unit ALLOCATED      Transfusion Status OK TO TRANSFUSE      Crossmatch Result Compatible      Unit Number [id number]      Blood Component Type RED CELLS,LR      Unit division 00      Status of Unit ALLOCATED      Transfusion Status OK TO TRANSFUSE      Crossmatch Result Compatible     ABO/RH     Status: Normal   Collection Time   09/27/11  6:25 PM      Component Value Range Comment   ABO/RH(D) A POS     POCT I-STAT, CHEM 8     Status: Abnormal   Collection Time   09/27/11  6:37 PM      Component Value  Range Comment   Sodium 139  135 - 145 mEq/L    Potassium 4.8  3.5 - 5.1 mEq/L    Chloride 104  96 - 112 mEq/L    BUN 12  6 - 23 mg/dL    Creatinine, Ser 6.38  0.50 - 1.35 mg/dL    Glucose, Bld 756 (*) 70 - 99 mg/dL    Calcium, Ion 4.33 (*) 1.12 - 1.32 mmol/L    TCO2 22  0 - 100 mmol/L    Hemoglobin 13.9  13.0 - 17.0 g/dL    HCT 29.5  18.8 - 41.6 %   URINALYSIS, ROUTINE W REFLEX MICROSCOPIC     Status: Abnormal   Collection Time   09/27/11  6:52 PM      Component Value Range Comment   Color, Urine YELLOW  YELLOW    APPearance CLEAR  CLEAR    Specific Gravity, Urine 1.019  1.005 - 1.030    pH 6.0  5.0 - 8.0    Glucose, UA NEGATIVE  NEGATIVE mg/dL    Hgb urine dipstick NEGATIVE  NEGATIVE    Bilirubin Urine NEGATIVE  NEGATIVE    Ketones, ur NEGATIVE  NEGATIVE mg/dL    Protein, ur NEGATIVE  NEGATIVE mg/dL    Urobilinogen, UA 0.2  0.0 - 1.0 mg/dL    Nitrite NEGATIVE  NEGATIVE    Leukocytes, UA SMALL (*) NEGATIVE   URINE MICROSCOPIC-ADD ON     Status: Normal   Collection Time  09/27/11  6:52 PM      Component Value Range Comment   Squamous Epithelial / LPF RARE  RARE    WBC, UA 0-2  <3 WBC/hpf    RBC / HPF 0-2  <3 RBC/hpf   PREPARE RBC (CROSSMATCH)     Status: Normal   Collection Time   09/27/11  7:00 PM      Component Value Range Comment   Order Confirmation ORDER PROCESSED BY BLOOD BANK       Dg Chest Port 1 View  09/27/2011  *RADIOLOGY REPORT*  Clinical Data: Severe bleeding from abdominoplasty incision.  PORTABLE CHEST - 1 VIEW  Comparison: None.  Findings: Heart size and vascularity are normal and the lungs are clear.  No osseous abnormality.  IMPRESSION: Normal chest.  Original Report Authenticated By: Gwynn Burly, M.D.    Pertinent items are noted in HPI. Temp:  [98 F (36.7 C)-98.5 F (36.9 C)] 98.5 F (36.9 C) (06/20 2040) Pulse Rate:  [58-89] 59  (06/20 2015) Resp:  [15-23] 18  (06/20 2040) BP: (108-133)/(60-69) 126/69 mmHg (06/20 2040) SpO2:  [100 %] 100 %  (06/20 1932) General appearance: alert, cooperative and no distress Resp: no distress Cardio: regular rate and rhythm GI: mildly tender (post surgical), pt has a blood soaked dressing along incision, his compression garment is also soled, he has several large JP drains with dark blood in them Extremities: extremities are warm, he is s/p lower arm amputation of left   Assessment/Plan: S/p abdominoplasty with bleeding probably secondary to fall  Plan: Pt is hemodynamically stable currently, he has been resusitated; he will be taken to the OR for wound exploration, evacuation of hematoma and closure.  Risks of reoperation have been discussed in detail with the patient, he will be observed overnight post op.  Wen Merced CHRISTOPHER 09/27/2011, 9:11 PM

## 2011-09-27 NOTE — ED Notes (Signed)
Patient lying in bed with legs placed up on pillows and blankets.  Denies pain states he just has a stomach ache

## 2011-09-27 NOTE — ED Notes (Signed)
EKG given to MD, copy placed in chart. 

## 2011-09-27 NOTE — Transfer of Care (Signed)
Immediate Anesthesia Transfer of Care Note  Patient: Danny Graves  Procedure(s) Performed: Procedure(s) (LRB): ABDOMINOPLASTY (N/A)  Patient Location: PACU  Anesthesia Type: General  Level of Consciousness: awake, alert  and oriented  Airway & Oxygen Therapy: Patient Spontanous Breathing and Patient connected to nasal cannula oxygen  Post-op Assessment: Report given to PACU RN  Post vital signs: Reviewed and stable  Complications: No apparent anesthesia complications

## 2011-09-28 ENCOUNTER — Encounter (HOSPITAL_COMMUNITY): Payer: Self-pay | Admitting: *Deleted

## 2011-09-28 MED ORDER — CEFAZOLIN SODIUM 1-5 GM-% IV SOLN
1.0000 g | Freq: Four times a day (QID) | INTRAVENOUS | Status: DC
  Administered 2011-09-28 (×2): 1 g via INTRAVENOUS
  Filled 2011-09-28 (×5): qty 50

## 2011-09-28 MED ORDER — ONDANSETRON HCL 4 MG PO TABS: 4.0000 mg | ORAL_TABLET | Freq: Four times a day (QID) | ORAL | Status: DC | PRN

## 2011-09-28 MED ORDER — MORPHINE SULFATE 4 MG/ML IJ SOLN: 4.0000 mg | INTRAMUSCULAR | Status: DC | PRN

## 2011-09-28 MED ORDER — ONDANSETRON HCL 4 MG/2ML IJ SOLN: 4.0000 mg | Freq: Four times a day (QID) | INTRAMUSCULAR | Status: DC | PRN

## 2011-09-28 MED ORDER — OXYCODONE-ACETAMINOPHEN 5-325 MG PO TABS
1.0000 | ORAL_TABLET | ORAL | Status: DC | PRN
  Administered 2011-09-28: 2 via ORAL
  Filled 2011-09-28: qty 2

## 2011-09-28 NOTE — Discharge Instructions (Signed)
Keep abd binder in place until seen by original surgeon, JP drain care as described by original surgeon, take medications as previously prescribed

## 2011-09-28 NOTE — Anesthesia Postprocedure Evaluation (Signed)
  Anesthesia Post-op Note  Patient: Danny Graves  Procedure(s) Performed: Procedure(s) (LRB): ABDOMINOPLASTY (N/A)  Patient Location: PACU  Anesthesia Type: General  Level of Consciousness: awake  Airway and Oxygen Therapy: Patient Spontanous Breathing  Post-op Pain: mild  Post-op Assessment: Post-op Vital signs reviewed  Post-op Vital Signs: Reviewed  Complications: No apparent anesthesia complications

## 2011-09-28 NOTE — Progress Notes (Signed)
S:pt feels much better, mild abd pain, has not been out of bed, has voided.  Offers no other complaints.  Pt has eaten breakfast.  O:Blood pressure 123/64, pulse 59, temperature 98.2 F (36.8 C), temperature source Oral, resp. rate 20, height 6' (1.829 m), weight 240 lb (108.863 kg), SpO2 100.00%. Results for orders placed in visit on 11/27/05  [facility] SMEAR     Status: Normal   Collection Time   12/07/05  1:58 PM      Component Value Range Status Comment   Smear Result Smear Available   Final   L Eye:  Mild swelling, va ok, wound looks good ABD:  Dressing essentially dry without significant soiling, drains with minimal output since last emptying - color not dark red; binder in place EXT: scds in place, warm  A:s/p abdominoplasty with re-exploration for bleeding pod #1, no evidence of continued bleding; Laceration of L eye lid   P: Pt is to get oob and ambulate, then may be d/c home.  He has a f/u appt with his physician at [facility] today; post operative protocol per his original surgeon; advised to be careful when gets up to make sure he is not light headed, has his balance...stay well hydrated, take pain medications and antibiotics as previously prescribed by his original surgeon.

## 2011-09-28 NOTE — Progress Notes (Signed)
1100 Discharge teaching completed including follow up care, wound care, drain care, medications and instructions.  Verbalizes understanding with no further questions.  Discharged to home by wheelchair accompanied by mother in stable condition.

## 2011-09-28 NOTE — Op Note (Signed)
[place], HIJAZI NO.:  [id number]  MEDICAL RECORD NO.:  [id number]  LOCATION:  [facility]                         FACILITY:  [facility]  PHYSICIAN:  Johnette Abraham, MD    DATE OF BIRTH:  1985/10/25  DATE OF PROCEDURE:  09/27/2011 DATE OF DISCHARGE:                              OPERATIVE REPORT   PREOPERATIVE DIAGNOSIS:  Postop bleeding, status post abdominoplasty.  POSTOPERATIVE DIAGNOSIS:  Postop bleeding, status post abdominoplasty.  PROCEDURE: 1. Exploration of the wound, evacuation of hematoma, cauterization of     bleeding vessels, layered closure of abdominal wound extensive. 2. Closure of upper eyelid laceration 2 cm.  SURGEON:  Marti Acebo C. Izora Ribas, M.D.  ASSISTANT:  Dr. Magnus Ivan.  ANESTHESIA:  General.  SPECIMENS:  No specimens.  FINDINGS:  A few small bleeding vessels on the anterior abdominal wall that were cauterized, evacuation of approximately 250 mL of clot.  INDICATIONS:  Mr. Mierzejewski is a pleasant gentleman who had an abdominoplasty done at [facility] this morning.  At home, he had a syncopal episode and fell, and then noticed large amount of bleeding through his dressing on to his compression garment as well as in his JP drain, so he was brought by the EMS to the nearest hospital.  He was evaluated in the emergency room.  His blood pressure was 108 systolic.  He was given fluid and given blood, and resuscitated.  I was consulted and spoke to the surgeon who performed the procedure at [facility] and decision was made to take him to the operating room urgently for exploration for possible active bleeding.  Risks of reoperation were thoroughly discussed with the patient and he has agreed with these risks and agreed to proceed with surgery.  PROCEDURE IN DETAIL:  The patient was taken to the operating room, placed supine on the operating room table.  Preoperative antibiotics were given.  Time-out was performed.  All extremities were padded.  His knees were  held in a flexed position and SCDs were placed.  The abdomen was prepped and draped in normal sterile fashion.  The staples were removed from the incision site as well as deep sutures.  All the way across the wound, the abdominal flap was elevated and a large amount of blood clots were removed.  There were a few small active bleeding vessels on the anterior abdominal wall that were cauterized.  After thorough irrigation and careful inspection of the remaining of the abdominal wall, both anterior flap around the pubic symphysis and all areas, there was no other active bleeding that was encountered. Following the deep fascia was approximated with a mini 2-0 Vicryl sutures nicely approximating the tissues, further sutures were used in the deep dermal subcutaneous levels to approximate it further and then the skin edges were everted and closed with staples.  All 3 drains that were used prior were positioned and left in place prior to closure.  The medial umbilicus was not disturbed and was pink at the conclusion of the case.  A sterile dressing was then applied and an abdominal binder was placed.  Next, attention was taken to the patient's left upper eyelid where approximately 2 cm laceration was  present from his fall.  This was cleansed.  The edges were gently debrided and the lacerations were closed with interrupted 6-0 chromic sutures nicely approximating the tissue.  Patient awake from anesthesia and tolerated the procedure well.     Johnette Abraham, MD     HCC/MEDQ  D:  09/27/2011  T:  09/28/2011  Job:  [id number]

## 2011-09-29 LAB — TYPE AND SCREEN
ABO/RH(D): A POS
Antibody Screen: NEGATIVE
Unit division: 0
Unit division: 0
Unit division: 0

## 2011-10-04 NOTE — Discharge Planning (Signed)
What to do after you leave the hospital:  Recommended diet: low fat, low cholesterol diet  Recommended activity: no lifting, driving, or strenuous exercise for until instructed  Follow-up with your surgeon at [facility]  Other instructions:

## 2011-10-06 NOTE — Discharge Summary (Signed)
  Admitting diagnosis:   Postoperative bleeding, Status post abdominoplasty  Discharge diagnosis: Same  Hospital course: A conversation with his original surgeon at [facility] was made. The patient was evaluated in the emergency room and prepared for the operating room.  He was taken to the OR and his wound was explored, please see the operative note for details.  Post operatively, he was watched until morning to ensure that his vital signs were stable, that he had no additional bleeding.  The pateints vital signs remained stable overnight, his JP drain output was not excessive.  He was able to void on his own and get oob  Prior to discharge.  Discharge instructions are for him to return to his original surgeon the day of discharge and follow his post operative instructions. His surgeon was notified of the intra-operative findings. Dr. Izora Ribas

## 2012-02-29 DIAGNOSIS — J45909 Unspecified asthma, uncomplicated: Secondary | ICD-10-CM | POA: Insufficient documentation

## 2014-06-26 ENCOUNTER — Emergency Department: Payer: Self-pay | Admitting: Emergency Medicine

## 2017-08-06 ENCOUNTER — Ambulatory Visit (INDEPENDENT_AMBULATORY_CARE_PROVIDER_SITE_OTHER): Payer: PRIVATE HEALTH INSURANCE | Admitting: Primary Care

## 2017-08-06 ENCOUNTER — Encounter (INDEPENDENT_AMBULATORY_CARE_PROVIDER_SITE_OTHER): Payer: Self-pay

## 2017-08-06 ENCOUNTER — Encounter: Payer: Self-pay | Admitting: *Deleted

## 2017-08-06 ENCOUNTER — Encounter: Payer: Self-pay | Admitting: Primary Care

## 2017-08-06 VITALS — BP 116/74 | HR 60 | Temp 98.0°F | Ht 71.0 in | Wt 250.5 lb

## 2017-08-06 DIAGNOSIS — J4520 Mild intermittent asthma, uncomplicated: Secondary | ICD-10-CM

## 2017-08-06 DIAGNOSIS — Z0000 Encounter for general adult medical examination without abnormal findings: Secondary | ICD-10-CM | POA: Insufficient documentation

## 2017-08-06 LAB — LIPID PANEL
CHOL/HDL RATIO: 3
CHOLESTEROL: 147 mg/dL (ref 0–200)
HDL: 51.8 mg/dL (ref >39.00)
LDL CALC: 88 mg/dL (ref 0–99)
NONHDL: 95.5
Triglycerides: 39 mg/dL (ref 0.0–149.0)
VLDL: 7.8 mg/dL (ref 0.0–40.0)

## 2017-08-06 LAB — COMPREHENSIVE METABOLIC PANEL
ALBUMIN: 4.1 g/dL (ref 3.5–5.2)
ALT: 28 U/L (ref 0–53)
AST: 32 U/L (ref 0–37)
Alkaline Phosphatase: 41 U/L (ref 39–117)
BUN: 12 mg/dL (ref 6–23)
CHLORIDE: 103 meq/L (ref 96–112)
CO2: 32 mEq/L (ref 19–32)
CREATININE: 0.86 mg/dL (ref 0.40–1.50)
Calcium: 9.2 mg/dL (ref 8.4–10.5)
GFR: 110.49 mL/min (ref >60.00)
GLUCOSE: 96 mg/dL (ref 70–99)
POTASSIUM: 4.5 meq/L (ref 3.5–5.1)
SODIUM: 140 meq/L (ref 135–145)
Total Bilirubin: 0.6 mg/dL (ref 0.2–1.2)
Total Protein: 6.8 g/dL (ref 6.0–8.3)

## 2017-08-06 NOTE — Assessment & Plan Note (Signed)
Diagnosed in childhood, no problems as an adult. No wheezing on exam today.

## 2017-08-06 NOTE — Patient Instructions (Signed)
Stop by the lab prior to leaving today. I will notify you of your results once received.   Continue exercising. You should be getting 150 minutes of moderate intensity exercise weekly.  Continue to work on a healthy diet.  Ensure you are consuming 64 ounces of water daily.  Follow up in 1 year for your annual exam or sooner if needed.  It was a pleasure to meet you today! Please don't hesitate to call or message me with any questions. Welcome to [facility]!

## 2017-08-06 NOTE — Assessment & Plan Note (Signed)
Immunizations UTD. Commended him on weight loss through changes in diet and regular exercise. Encouraged to continue with healthy lifestyle changes. Exam unremarkable. Labs pending. Follow up in 1 year.

## 2017-08-06 NOTE — Progress Notes (Signed)
Subjective:    Patient ID: Danny Graves, male    DOB: [birth year], 32 y.o.   MRN: [id number]  HPI  Mr. Wojcicki is a 32 year old male who presents today to establish care and for complete physical.  1) Asthma: Diagnosed in childhood, no history of symptoms since. No recent use of inhalers. He does experience intermittent allergies.   2) Obesity: Weight loss of 160-170 pounds over the last 7 years through diet and exercise. No history of bariatric surgery.     Immunizations: -Tetanus: Completed in 2018 -Influenza: Completed last season   Diet: He endorses a fair diet. Does have periods of binge eating.  Breakfast: Oatmeal, fruit, protein Lunch: Protein, vegetables, starch; restaurants mostly.  Dinner: Public affairs consultant mostly. Protein, vegetables, starch.  Snacks: None Desserts: None Beverages: Water, almond milk   Exercise: He is exercising at the gym 3 times weekly, running 3-4 times weekly. Started this regimen 2-3 months ago.   Eye exam: Completed one year ago Dental exam: Completes annually    Review of Systems  Constitutional: Negative for unexpected weight change.  HENT: Negative for rhinorrhea.   Respiratory: Negative for cough and shortness of breath.   Cardiovascular: Negative for chest pain.  Gastrointestinal: Negative for constipation and diarrhea.  Genitourinary: Negative for difficulty urinating.  Musculoskeletal: Negative for arthralgias and myalgias.  Skin: Negative for rash.  Allergic/Immunologic: Negative for environmental allergies.  Neurological: Negative for dizziness, numbness and headaches.  Psychiatric/Behavioral: The patient is not nervous/anxious.        Past Medical History:  Diagnosis Date  . Asthma   . Chicken pox   . Depression   . GERD (gastroesophageal reflux disease)   . Hidradenitis suppurativa   . Obesity   . UTI (urinary tract infection)      Social History   Socioeconomic History  . Marital status: Single    Spouse name: Not on  file  . Number of children: Not on file  . Years of education: Not on file  . Highest education level: Not on file  Occupational History  . Not on file  Social Needs  . Financial resource strain: Not on file  . Food insecurity:    Worry: Not on file    Inability: Not on file  . Transportation needs:    Medical: Not on file    Non-medical: Not on file  Tobacco Use  . Smoking status: Never Smoker  . Smokeless tobacco: Never Used  Substance and Sexual Activity  . Alcohol use: Yes  . Drug use: No  . Sexual activity: Not on file  Lifestyle  . Physical activity:    Days per week: Not on file    Minutes per session: Not on file  . Stress: Not on file  Relationships  . Social connections:    Talks on phone: Not on file    Gets together: Not on file    Attends religious service: Not on file    Active member of club or organization: Not on file    Attends meetings of clubs or organizations: Not on file    Relationship status: Not on file  . Intimate partner violence:    Fear of current or ex partner: Not on file    Emotionally abused: Not on file    Physically abused: Not on file    Forced sexual activity: Not on file  Other Topics Concern  . Not on file  Social History Narrative  . Not on file  Past Surgical History:  Procedure Laterality Date  . ABDOMINAL SURGERY    . ABDOMINOPLASTY  09/27/2011   Procedure: ABDOMINOPLASTY;  Surgeon: Johnette Abraham, MD;  Location: [facility] OR;  Service: Plastics;  Laterality: N/A;  Exploration and evacuation of abdominal wound  . COSMETIC SURGERY    . MYRINGOTOMY    . TONSILLECTOMY      Family History  Problem Relation Age of Onset  . Depression Mother   . Diabetes Mother   . Early death Mother   . Alcohol abuse Father   . Heart attack Father   . Hyperlipidemia Father   . COPD Maternal Grandmother   . Diabetes Maternal Grandmother   . Heart attack Maternal Grandmother   . Diabetes Maternal Grandfather   . Heart attack Maternal  Grandfather   . Heart disease Maternal Grandfather     No Known Allergies  No current outpatient medications on file prior to visit.   No current facility-administered medications on file prior to visit.     BP 116/74   Pulse 60   Temp 98 F (36.7 C) (Oral)   Ht  (1.803 m)   Wt 250 lb 8 oz (113.6 kg)   SpO2 98%   BMI 34.94 kg/m    Objective:   Physical Exam  Constitutional: He is oriented to person, place, and time. He appears well-nourished.  HENT:  Right Ear: Tympanic membrane and ear canal normal.  Left Ear: Tympanic membrane and ear canal normal.  Nose: Nose normal. Right sinus exhibits no maxillary sinus tenderness and no frontal sinus tenderness. Left sinus exhibits no maxillary sinus tenderness and no frontal sinus tenderness.  Mouth/Throat: Oropharynx is clear and moist.  Eyes: Pupils are equal, round, and reactive to light. Conjunctivae and EOM are normal.  Neck: Neck supple. Carotid bruit is not present. No thyromegaly present.  Cardiovascular: Normal rate, regular rhythm and normal heart sounds.  Pulmonary/Chest: Effort normal and breath sounds normal. He has no wheezes. He has no rales.  Abdominal: Soft. Bowel sounds are normal. There is no tenderness.  Musculoskeletal: Normal range of motion.  Missing left upper extremity from elbow down  Neurological: He is alert and oriented to person, place, and time. He has normal reflexes. No cranial nerve deficit.  Skin: Skin is warm and dry.  Psychiatric: He has a normal mood and affect.          Assessment & Plan:

## 2017-08-13 ENCOUNTER — Telehealth: Payer: PRIVATE HEALTH INSURANCE | Admitting: Family

## 2017-08-13 DIAGNOSIS — R300 Dysuria: Secondary | ICD-10-CM

## 2017-08-13 NOTE — Progress Notes (Signed)
Based on what you shared with me it looks like you have a condition that should be evaluated in a face to face office visit.  NOTE: If you entered your credit card information for this eVisit, you will not be charged. You may see a "hold" on your card for the $30 but that hold will drop off and you will not have a charge processed.  If you are having a true medical emergency please call 911.  If you need an urgent face to face visit, [facility] has four urgent care centers for your convenience.  If you need care fast and have a high deductible or no insurance consider:   [email or web address] to reserve your spot online an avoid wait times  [facility] [address], [address] [place], [place] [address] 8 am to 8 pm Monday-Friday 10 am to 4 pm Saturday-Sunday *Across the street from [employer]  [facility]  [address] [place], [address] 8 am to 5 pm Monday-Friday * In the [facility]   The following sites will take your  insurance:  . [facility]  [phone] Get Driving Directions Find a Provider at this Location  [address] [place], [place] [address] . 10 am to 8 pm Monday-Friday . 12 pm to 8 pm Saturday-Sunday   . [facility]  [phone] Get Driving Directions Find a Provider at this Location  [address], [address] [place], [place] [address] . 8 am to 8 pm Monday-Friday . 9 am to 6 pm Saturday . 11 am to 6 pm Sunday   . [facility] [place]  [phone] Get Driving Directions  [address] [place], [place] [address] . 8 am to 8 pm Monday-Friday . 8 am to 4 pm Saturday-Sunday   Your e-visit answers were reviewed by a board certified advanced clinical practitioner to complete your personal care plan.  Thank you for using e-Visits. 

## 2018-02-26 ENCOUNTER — Telehealth: Payer: PRIVATE HEALTH INSURANCE | Admitting: Nurse Practitioner

## 2018-02-26 DIAGNOSIS — R112 Nausea with vomiting, unspecified: Secondary | ICD-10-CM

## 2018-02-26 MED ORDER — ONDANSETRON HCL 4 MG PO TABS: 4.0000 mg | ORAL_TABLET | Freq: Three times a day (TID) | ORAL | 0 refills | Status: DC | PRN

## 2018-02-26 NOTE — Progress Notes (Signed)

## 2018-07-31 ENCOUNTER — Telehealth: Payer: Self-pay | Admitting: Primary Care

## 2018-07-31 NOTE — Telephone Encounter (Signed)
Called to r/s cpe lab and cpe. No answer and voicemail full.

## 2018-08-07 NOTE — Telephone Encounter (Signed)
Attempted to reach patient again to r/s. No answer and voicemail full.

## 2018-08-08 ENCOUNTER — Other Ambulatory Visit: Payer: PRIVATE HEALTH INSURANCE

## 2018-08-11 ENCOUNTER — Encounter: Payer: PRIVATE HEALTH INSURANCE | Admitting: Primary Care

## 2018-10-07 DIAGNOSIS — L700 Acne vulgaris: Secondary | ICD-10-CM | POA: Diagnosis not present

## 2018-10-07 DIAGNOSIS — L730 Acne keloid: Secondary | ICD-10-CM | POA: Diagnosis not present

## 2018-10-07 DIAGNOSIS — L720 Epidermal cyst: Secondary | ICD-10-CM | POA: Diagnosis not present

## 2018-10-07 DIAGNOSIS — L731 Pseudofolliculitis barbae: Secondary | ICD-10-CM | POA: Diagnosis not present

## 2018-12-01 DIAGNOSIS — L080 Pyoderma: Secondary | ICD-10-CM | POA: Diagnosis not present

## 2018-12-01 DIAGNOSIS — D485 Neoplasm of uncertain behavior of skin: Secondary | ICD-10-CM | POA: Diagnosis not present

## 2018-12-08 DIAGNOSIS — L905 Scar conditions and fibrosis of skin: Secondary | ICD-10-CM | POA: Diagnosis not present

## 2018-12-08 DIAGNOSIS — D485 Neoplasm of uncertain behavior of skin: Secondary | ICD-10-CM | POA: Diagnosis not present

## 2018-12-16 DIAGNOSIS — L905 Scar conditions and fibrosis of skin: Secondary | ICD-10-CM | POA: Diagnosis not present

## 2018-12-16 DIAGNOSIS — Z4802 Encounter for removal of sutures: Secondary | ICD-10-CM | POA: Diagnosis not present

## 2019-01-15 DIAGNOSIS — Z713 Dietary counseling and surveillance: Secondary | ICD-10-CM | POA: Diagnosis not present

## 2019-02-04 DIAGNOSIS — Z20828 Contact with and (suspected) exposure to other viral communicable diseases: Secondary | ICD-10-CM | POA: Diagnosis not present

## 2019-02-15 DIAGNOSIS — H16252 Phlyctenular keratoconjunctivitis, left eye: Secondary | ICD-10-CM | POA: Diagnosis not present

## 2019-02-16 DIAGNOSIS — L709 Acne, unspecified: Secondary | ICD-10-CM

## 2019-03-03 DIAGNOSIS — H10811 Pingueculitis, right eye: Secondary | ICD-10-CM | POA: Diagnosis not present

## 2019-03-10 NOTE — Telephone Encounter (Signed)
Please set him up for an office visit.

## 2019-03-18 ENCOUNTER — Other Ambulatory Visit: Payer: Self-pay

## 2019-03-18 ENCOUNTER — Ambulatory Visit: Payer: BC Managed Care – PPO | Admitting: Primary Care

## 2019-03-18 ENCOUNTER — Encounter: Payer: Self-pay | Admitting: Primary Care

## 2019-03-18 VITALS — BP 120/74 | HR 65 | Temp 97.4°F | Ht 71.0 in | Wt 264.2 lb

## 2019-03-18 DIAGNOSIS — M546 Pain in thoracic spine: Secondary | ICD-10-CM

## 2019-03-18 DIAGNOSIS — M549 Dorsalgia, unspecified: Secondary | ICD-10-CM | POA: Insufficient documentation

## 2019-03-18 DIAGNOSIS — R400 Somnolence: Secondary | ICD-10-CM | POA: Diagnosis not present

## 2019-03-18 NOTE — Assessment & Plan Note (Signed)
Suspect this was initiated during his [employer] work out and then further aggravated by subsequent work outs and then running.  Exam today without alarm signs. Suspect [facility] involvement.  Discussed stretching, ice/heat, NSAID's. He will update. Consider PT if needed.

## 2019-03-18 NOTE — Assessment & Plan Note (Signed)
Chronic morbid obesity, snoring. Epworth Sleepiness Scale Score of 11 today. Referral placed to pulmonology for evaluation for sleep study.

## 2019-03-18 NOTE — Progress Notes (Signed)
Subjective:    Patient ID: Danny Graves, male    DOB: Mar 24, 1986, 33 y.o.   MRN: [id number]  HPI  Mr. Strollo is a 33 year old male who presents today with multiple complaints.  1) Back Pain: Located to the right lower thoracic back that he first noticed one week ago. He describes his pain as "burning, sharp" that is intermittent with extension or moderate flexion. Also believes he's seen a bruise.   He's tried stretching with temporary improvement at the time but thinks his symptoms are now worse. He is active in CrossFit and went twice last week, once prior to his symptoms and again after symptoms. He also ran two days after his symptoms began.  He denies numbness/tingling, radiculopathy down lower extremities, weakness, abrupt trauma. He's not taken anything OTC for symptoms.   2) Sleep Disturbance/Daytime tiredness: Difficulty with staying asleep for the last one year, will wake 1-2 times during the night. He also endorses daytime tiredness around 3 pm when coming home from work, also doesn't ever feel well rested when he wakes. He does feel as though he could fall asleep easily when driving home from work several days weekly, doesn't feel this way while at work. He goes to bed around 10:30 pm, alarm goes off around 7:30 am. Will lay in bed for 30 minutes to one hour before falling asleep.   Chronic history of snoring, history of morbid obesity with a weight in the 400 pound range. Snoring improved with weight loss but continues. Epworth Sleepiness Score of 11 today. He has never had a sleep study.  Wt Readings from Last 3 Encounters:  03/18/19 264 lb 4 oz (119.9 kg)  08/06/17 250 lb 8 oz (113.6 kg)  09/27/11 240 lb (108.9 kg)      BP Readings from Last 3 Encounters:  03/18/19 120/74  08/06/17 116/74  09/28/11 (!) 117/45     Review of Systems  Respiratory: Negative for shortness of breath.        Snoring. See HPI  Musculoskeletal: Positive for back pain.  Neurological:  Negative for weakness and numbness.  Psychiatric/Behavioral: Positive for sleep disturbance.       Past Medical History:  Diagnosis Date  . Asthma   . Chicken pox   . Depression   . GERD (gastroesophageal reflux disease)   . Hidradenitis suppurativa   . Obesity   . UTI (urinary tract infection)      Social History   Socioeconomic History  . Marital status: Single    Spouse name: Not on file  . Number of children: Not on file  . Years of education: Not on file  . Highest education level: Not on file  Occupational History  . Not on file  Social Needs  . Financial resource strain: Not on file  . Food insecurity    Worry: Not on file    Inability: Not on file  . Transportation needs    Medical: Not on file    Non-medical: Not on file  Tobacco Use  . Smoking status: Never Smoker  . Smokeless tobacco: Never Used  Substance and Sexual Activity  . Alcohol use: Yes  . Drug use: No  . Sexual activity: Not on file  Lifestyle  . Physical activity    Days per week: Not on file    Minutes per session: Not on file  . Stress: Not on file  Relationships  . Social connections    Talks on phone: Not on  file    Gets together: Not on file    Attends religious service: Not on file    Active member of club or organization: Not on file    Attends meetings of clubs or organizations: Not on file    Relationship status: Not on file  . Intimate partner violence    Fear of current or ex partner: Not on file    Emotionally abused: Not on file    Physically abused: Not on file    Forced sexual activity: Not on file  Other Topics Concern  . Not on file  Social History Narrative   Single.    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   Exercises most days of the week.    Past Surgical History:  Procedure Laterality Date  . ABDOMINAL SURGERY    . ABDOMINOPLASTY  09/27/2011   Procedure: ABDOMINOPLASTY;  Surgeon: Dennie Bible, MD;  Location: [facility];  Service: Plastics;   Laterality: N/A;  Exploration and evacuation of abdominal wound  . COSMETIC SURGERY    . MYRINGOTOMY    . TONSILLECTOMY      Family History  Problem Relation Age of Onset  . Depression Mother   . Diabetes Mother   . Early death Mother   . Alcohol abuse Father   . Heart attack Father   . Hyperlipidemia Father   . COPD Maternal Grandmother   . Diabetes Maternal Grandmother   . Heart attack Maternal Grandmother   . Diabetes Maternal Grandfather   . Heart attack Maternal Grandfather   . Heart disease Maternal Grandfather     No Known Allergies  No current outpatient medications on file prior to visit.   No current facility-administered medications on file prior to visit.     BP 120/74   Pulse 65   Temp (!) 97.4 F (36.3 C) (Temporal)   Ht 5\' 11"  (1.803 m)   Wt 264 lb 4 oz (119.9 kg)   SpO2 98%   BMI 36.86 kg/m    Objective:   Physical Exam  Constitutional: He appears well-nourished.  Respiratory: Effort normal.  Musculoskeletal:     Thoracic back: He exhibits decreased range of motion and pain. He exhibits no tenderness and no spasm.       Back:     Comments: Pain and decrease in ROM with mild extension, moderate flexion.  Negative straight leg raise bilaterally.           Assessment & Plan:

## 2019-03-18 NOTE — Patient Instructions (Signed)
You will be contacted regarding your referral to pulmonology for the sleep study.  Please let us know if you have not been contacted within two weeks.   Use ice/heat to the back for relive. You can take Ibuprofen, naproxen as needed for pain.   Work on stretching as discussed.  It was a pleasure to see you today!

## 2019-03-20 ENCOUNTER — Other Ambulatory Visit: Payer: Self-pay

## 2019-03-20 ENCOUNTER — Encounter: Payer: Self-pay | Admitting: Pulmonary Disease

## 2019-03-20 ENCOUNTER — Ambulatory Visit: Payer: BC Managed Care – PPO | Admitting: Pulmonary Disease

## 2019-03-20 VITALS — BP 118/70 | HR 56 | Temp 97.4°F | Ht 71.0 in | Wt 262.6 lb

## 2019-03-20 DIAGNOSIS — R0683 Snoring: Secondary | ICD-10-CM | POA: Diagnosis not present

## 2019-03-20 NOTE — Patient Instructions (Signed)
Will arrange for home sleep study Will call to arrange for follow up after sleep study reviewed  

## 2019-03-20 NOTE — Progress Notes (Signed)
   Subjective:    Patient ID: Danny Graves, male    DOB: 04-06-1986, 33 y.o.   MRN: [id number]  HPI    Review of Systems  Constitutional: Negative for fever and unexpected weight change.  HENT: Positive for congestion and sneezing. Negative for dental problem, ear pain, nosebleeds, postnasal drip, rhinorrhea, sinus pressure, sore throat and trouble swallowing.   Eyes: Negative for redness and itching.  Respiratory: Negative for cough, chest tightness, shortness of breath and wheezing.   Cardiovascular: Negative for palpitations and leg swelling.  Gastrointestinal: Negative for nausea and vomiting.  Genitourinary: Negative for dysuria.  Musculoskeletal: Negative for joint swelling.  Skin: Negative for rash.  Neurological: Negative for headaches.  Hematological: Does not bruise/bleed easily.  Psychiatric/Behavioral: Negative for dysphoric mood.       Objective:   Physical Exam        Assessment & Plan:

## 2019-03-20 NOTE — Progress Notes (Signed)
[facility], Critical Care, and Sleep Medicine  Chief Complaint  Patient presents with  . Sleep Consult    Constitutional:  BP 118/70 (BP Location: Right Arm, Cuff Size: Large)   Pulse (!) 56   Temp (!) 97.4 F (36.3 C) (Oral)   Ht 5\' 11"  (1.803 m)   Wt 262 lb 9.6 oz (119.1 kg)   SpO2 96%   BMI 36.63 kg/m   Past Medical History:  Asthma, Depression, GERD, Hidradenitis suppurativa  Brief Summary:  Danny Graves is a 33 y.o. male with snoring.  He has noticed trouble with his sleep for the past year.  This has been getting worse.  He doesn't have trouble falling asleep or staying asleep.  He feels like he never gets quality sleep and feels tired during the day.  He snores and will wake up hearing himself snore.  He has trouble sleeping on his back.  He doesn't dream.  Has trouble staying focused when working on a computer.  He goes to sleep at 11 pm.  He falls asleep in 15 minutes.  He wakes up some times to use the bathroom.  He gets out of bed at 8 am.  He feels tire in the morning.  He denies morning headache.  He does not use anything to help him fall sleep.  Will use energy drinks couple times a week to help stay awake.  He denies sleep walking, sleep talking, bruxism, or nightmares.  There is no history of restless legs.  He denies sleep hallucinations, sleep paralysis, or cataplexy.  The Epworth score is 10 out of 24.   Physical Exam:   Appearance - well kempt   ENMT - clear nasal mucosa, midline nasal  septum, no oral exudates, no LAN, trachea midline, elongated uvula, low laying soft palate, MP 3  Respiratory - normal chest wall, normal respiratory effort, no accessory muscle use, no wheeze/rales  CV - s1s2 regular rate and rhythm, no murmurs, no peripheral edema, radial pulses symmetric  GI - soft, non tender, no masses  Lymph - no adenopathy noted in neck and axillary areas  MSK - normal gait  Ext - no cyanosis, clubbing, or joint inflammation noted;  missing Lt distal arm  Skin - no rashes, lesions, or ulcers  Neuro - normal strength, oriented x 3  Psych - normal mood and affect  Discussion:  He has snoring, sleep disruption, apnea, and daytime sleepiness. He has history of depression.  I am concerned he could have sleep apnea.  Assessment/Plan:   Snoring with excessive daytime sleepiness. - will need to arrange for a home sleep study  Obesity. - discussed how weight can impact sleep and risk for sleep disordered breathing - discussed options to assist with weight loss: combination of diet modification, cardiovascular and strength training exercises  Cardiovascular risk. - had an extensive discussion regarding the adverse health consequences related to untreated sleep disordered breathing - specifically discussed the risks for hypertension, coronary artery disease, cardiac dysrhythmias, cerebrovascular disease, and diabetes - lifestyle modification discussed  Safe driving practices. - discussed how sleep disruption can increase risk of accidents, particularly when driving - safe driving practices were discussed  Therapies for obstructive sleep apnea. - if the sleep study shows significant sleep apnea, then various therapies for treatment were reviewed: CPAP, oral appliance, and surgical interventions      Patient Instructions  Will arrange for home sleep study Will call to arrange for follow up after sleep study reviewed  Coralyn Helling, MD [name] Pager: [phone] 03/20/2019, 4:22 PM  Flow Sheet    Sleep tests:    Review of Systems:  Constitutional: Negative for fever and unexpected weight change.  HENT: Positive for congestion and sneezing. Negative for dental problem, ear pain, nosebleeds, postnasal drip, rhinorrhea, sinus pressure, sore throat and trouble swallowing.   Eyes: Negative for redness and itching.  Respiratory: Negative for cough, chest tightness, shortness of  breath and wheezing.   Cardiovascular: Negative for palpitations and leg swelling.  Gastrointestinal: Negative for nausea and vomiting.  Genitourinary: Negative for dysuria.  Musculoskeletal: Negative for joint swelling.  Skin: Negative for rash.  Neurological: Negative for headaches.  Hematological: Does not bruise/bleed easily.  Psychiatric/Behavioral: Negative for dysphoric mood.   Medications:   Allergies as of 03/20/2019   No Known Allergies     Medication List    as of March 20, 2019  4:22 PM   You have not been prescribed any medications.     Past Surgical History:  He  has a past surgical history that includes Abdominal surgery; Cosmetic surgery; Tonsillectomy; Myringotomy; and Abdominoplasty (09/27/2011).  Family History:  His family history includes Alcohol abuse in his father; COPD in his maternal grandmother; Depression in his mother; Diabetes in his maternal grandfather, maternal grandmother, and mother; Early death in his mother; Heart attack in his father, maternal grandfather, and maternal grandmother; Heart disease in his maternal grandfather; Hyperlipidemia in his father.  Social History:  He  reports that he has never smoked. He has never used smokeless tobacco. He reports current alcohol use. He reports that he does not use drugs.

## 2019-04-24 ENCOUNTER — Other Ambulatory Visit: Payer: Self-pay

## 2019-04-24 ENCOUNTER — Ambulatory Visit: Payer: BC Managed Care – PPO | Admitting: Primary Care

## 2019-04-24 ENCOUNTER — Encounter: Payer: Self-pay | Admitting: Primary Care

## 2019-04-24 VITALS — BP 116/64 | HR 78 | Temp 96.2°F | Resp 16 | Ht 71.0 in | Wt 264.8 lb

## 2019-04-24 DIAGNOSIS — F4323 Adjustment disorder with mixed anxiety and depressed mood: Secondary | ICD-10-CM

## 2019-04-24 MED ORDER — CITALOPRAM HYDROBROMIDE 20 MG PO TABS: 20.0000 mg | ORAL_TABLET | Freq: Every day | ORAL | 1 refills | Status: AC

## 2019-04-24 NOTE — Patient Instructions (Signed)
Start citalopram 20 mg tablets once daily for depression. Take 1/2 tablet daily for 6 days, then increase to 1 full tablet thereafter.  Please schedule a follow up visit for 6 weeks as discussed.   It was a pleasure to see you today!

## 2019-04-24 NOTE — Progress Notes (Signed)
Subjective:    Patient ID: Danny Graves, male    DOB: 1986-01-12, 34 y.o.   MRN: [id number]  HPI  This visit occurred during the SARS-CoV-2 public health emergency.  Safety protocols were in place, including screening questions prior to the visit, additional usage of staff PPE, and extensive cleaning of exam room while observing appropriate contact time as indicated for disinfecting solutions.    Danny Graves is a 34 year old male who presents today to discuss depression.  BP Readings from Last 3 Encounters:  04/24/19 116/64  03/20/19 118/70  03/18/19 120/74   History of intermittent chronic depression over the years, also history of ADHD as child. He has tried therapy in the past, didn't feel as though it was effective. He's never tried medication.   Over the last year month he's been under a lot of stress with work and family, was injured at the gym during the Summer which has kept him from working out, he's not happy with his occupation.  Symptoms include little motivation to do things, little apathy, poor diet, easy irritability. GAD 7 score of 6 and PHQ 9 score of 15 today. Denies SI/HI.  Review of Systems  Respiratory: Negative for shortness of breath.   Cardiovascular: Negative for chest pain and palpitations.  Psychiatric/Behavioral:       See HPI       Past Medical History:  Diagnosis Date  . Asthma   . Chicken pox   . Depression   . GERD (gastroesophageal reflux disease)   . Hidradenitis suppurativa   . Obesity   . UTI (urinary tract infection)      Social History   Socioeconomic History  . Marital status: Single    Spouse name: Not on file  . Number of children: Not on file  . Years of education: Not on file  . Highest education level: Not on file  Occupational History  . Not on file  Tobacco Use  . Smoking status: Never Smoker  . Smokeless tobacco: Never Used  Substance and Sexual Activity  . Alcohol use: Yes  . Drug use: No  . Sexual activity: Not  on file  Other Topics Concern  . Not on file  Social History Narrative   Single.    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   Exercises most days of the week.   Social Determinants of Health   Financial Resource Strain:   . Difficulty of Paying Living Expenses: Not on file  Food Insecurity:   . Worried About Charity fundraiser in the Last Year: Not on file  . Ran Out of Food in the Last Year: Not on file  Transportation Needs:   . Lack of Transportation (Medical): Not on file  . Lack of Transportation (Non-Medical): Not on file  Physical Activity:   . Days of Exercise per Week: Not on file  . Minutes of Exercise per Session: Not on file  Stress:   . Feeling of Stress : Not on file  Social Connections:   . Frequency of Communication with Friends and Family: Not on file  . Frequency of Social Gatherings with Friends and Family: Not on file  . Attends Religious Services: Not on file  . Active Member of Clubs or Organizations: Not on file  . Attends Archivist Meetings: Not on file  . Marital Status: Not on file  Intimate Partner Violence:   . Fear of Current or Ex-Partner: Not on file  .  Emotionally Abused: Not on file  . Physically Abused: Not on file  . Sexually Abused: Not on file    Past Surgical History:  Procedure Laterality Date  . ABDOMINAL SURGERY    . ABDOMINOPLASTY  09/27/2011   Procedure: ABDOMINOPLASTY;  Surgeon: Johnette Abraham, MD;  Location: [facility] OR;  Service: Plastics;  Laterality: N/A;  Exploration and evacuation of abdominal wound  . COSMETIC SURGERY    . MYRINGOTOMY    . TONSILLECTOMY      Family History  Problem Relation Age of Onset  . Depression Mother   . Diabetes Mother   . Early death Mother   . Alcohol abuse Father   . Heart attack Father   . Hyperlipidemia Father   . COPD Maternal Grandmother   . Diabetes Maternal Grandmother   . Heart attack Maternal Grandmother   . Diabetes Maternal Grandfather   . Heart attack  Maternal Grandfather   . Heart disease Maternal Grandfather     No Known Allergies  No current outpatient medications on file prior to visit.   No current facility-administered medications on file prior to visit.    BP 116/64   Pulse 78   Temp (!) 96.2 F (35.7 C) (Temporal)   Resp 16   Ht 5\' 11"  (1.803 m)   Wt 264 lb 12.8 oz (120.1 kg)   SpO2 97%   BMI 36.93 kg/m    Objective:   Physical Exam  Constitutional: He appears well-nourished.  Cardiovascular: Normal rate and regular rhythm.  Respiratory: Effort normal and breath sounds normal.  Musculoskeletal:     Cervical back: Neck supple.  Skin: Skin is warm and dry.  Psychiatric: He has a normal mood and affect.           Assessment & Plan:

## 2019-04-24 NOTE — Assessment & Plan Note (Signed)
Chronic depression for years, treated with therapy in the past which was not helpful. Active depression with slight anxiety on exam today. GAD 7 score of 6 and PHQ 9 score of 15 today. Denies SI/HI.  Discussed options for treatment, Rx for citalopram sent to pharmacy. Patient is to take 1/2 tablet daily for 6 days, then advance to 1 full tablet thereafter. We discussed possible side effects of headache, GI upset, drowsiness, and SI/HI. If thoughts of SI/HI develop, we discussed to present to the emergency immediately. Patient verbalized understanding.   Follow up in 6 weeks for re-evaluation.

## 2019-05-19 ENCOUNTER — Other Ambulatory Visit: Payer: Self-pay | Admitting: Primary Care

## 2019-05-19 DIAGNOSIS — F4323 Adjustment disorder with mixed anxiety and depressed mood: Secondary | ICD-10-CM

## 2019-06-05 ENCOUNTER — Ambulatory Visit: Payer: BC Managed Care – PPO | Admitting: Primary Care

## 2019-06-12 ENCOUNTER — Ambulatory Visit: Payer: BC Managed Care – PPO | Admitting: Primary Care
